# Patient Record
Sex: Male | Born: 1985 | Race: White | Hispanic: No | Marital: Married | Smoking: Never smoker
Health system: Southern US, Community
[De-identification: ages and names within clinical notes are randomized; demographics above are authoritative.]

## PROBLEM LIST (undated history)

## (undated) HISTORY — PX: APPENDECTOMY: SHX54

---

## 2015-06-30 ENCOUNTER — Emergency Department (HOSPITAL_COMMUNITY): Payer: Self-pay

## 2015-06-30 ENCOUNTER — Encounter (HOSPITAL_COMMUNITY): Payer: Self-pay

## 2015-06-30 ENCOUNTER — Emergency Department (HOSPITAL_COMMUNITY)
Admission: EM | Admit: 2015-06-30 | Discharge: 2015-06-30 | Disposition: A | Discharge status: Home / Self Care | Payer: Self-pay | Attending: Emergency Medicine | Admitting: Emergency Medicine

## 2015-06-30 DIAGNOSIS — H6692 Otitis media, unspecified, left ear: Secondary | ICD-10-CM

## 2015-06-30 DIAGNOSIS — R1033 Periumbilical pain: Secondary | ICD-10-CM | POA: Insufficient documentation

## 2015-06-30 DIAGNOSIS — R109 Unspecified abdominal pain: Secondary | ICD-10-CM

## 2015-06-30 LAB — URINALYSIS, ROUTINE W REFLEX MICROSCOPIC
BILIRUBIN URINE: NEGATIVE
GLUCOSE, UA: NEGATIVE mg/dL
Hgb urine dipstick: NEGATIVE
KETONES UR: NEGATIVE mg/dL
LEUKOCYTES UA: NEGATIVE
NITRITE: NEGATIVE
PROTEIN: NEGATIVE mg/dL
Specific Gravity, Urine: 1.019 (ref 1.005–1.030)
pH: 7 (ref 5.0–8.0)

## 2015-06-30 LAB — COMPREHENSIVE METABOLIC PANEL
ALK PHOS: 71 U/L (ref 38–126)
ALT: 30 U/L (ref 17–63)
AST: 23 U/L (ref 15–41)
Albumin: 4 g/dL (ref 3.5–5.0)
Anion gap: 7 (ref 5–15)
BILIRUBIN TOTAL: 0.4 mg/dL (ref 0.3–1.2)
BUN: 13 mg/dL (ref 6–20)
CALCIUM: 9.3 mg/dL (ref 8.9–10.3)
CO2: 27 mmol/L (ref 22–32)
CREATININE: 0.95 mg/dL (ref 0.61–1.24)
Chloride: 101 mmol/L (ref 101–111)
GFR calc Af Amer: >60 mL/min (ref >60)
GLUCOSE: 98 mg/dL (ref 65–99)
Potassium: 4.1 mmol/L (ref 3.5–5.1)
Sodium: 135 mmol/L (ref 135–145)
TOTAL PROTEIN: 7.7 g/dL (ref 6.5–8.1)

## 2015-06-30 LAB — LIPASE, BLOOD: Lipase: 34 U/L (ref 11–51)

## 2015-06-30 LAB — CBC
HCT: 47.3 % (ref 39.0–52.0)
Hemoglobin: 15.2 g/dL (ref 13.0–17.0)
MCH: 28.4 pg (ref 26.0–34.0)
MCHC: 32.1 g/dL (ref 30.0–36.0)
MCV: 88.4 fL (ref 78.0–100.0)
PLATELETS: 203 10*3/uL (ref 150–400)
RBC: 5.35 MIL/uL (ref 4.22–5.81)
RDW: 13.4 % (ref 11.5–15.5)
WBC: 6.9 10*3/uL (ref 4.0–10.5)

## 2015-06-30 MED ORDER — SODIUM CHLORIDE 0.9 % IV BOLUS (SEPSIS)
1000.0000 mL | Freq: Once | INTRAVENOUS | Status: AC
  Administered 2015-06-30: 1000 mL via INTRAVENOUS

## 2015-06-30 MED ORDER — AMOXICILLIN 500 MG PO CAPS
1000.0000 mg | ORAL_CAPSULE | Freq: Once | ORAL | Status: AC
Start: 2015-06-30 — End: 2015-06-30
  Administered 2015-06-30: 1000 mg via ORAL
  Filled 2015-06-30: qty 2

## 2015-06-30 MED ORDER — IOPAMIDOL (ISOVUE-300) INJECTION 61%
INTRAVENOUS | Status: AC
  Administered 2015-06-30: 100 mL
  Filled 2015-06-30: qty 100

## 2015-06-30 MED ORDER — ONDANSETRON HCL 4 MG/2ML IJ SOLN
4.0000 mg | Freq: Once | INTRAMUSCULAR | Status: AC
  Administered 2015-06-30: 4 mg via INTRAVENOUS
  Filled 2015-06-30: qty 2

## 2015-06-30 MED ORDER — METHOCARBAMOL 500 MG PO TABS: 500.0000 mg | ORAL_TABLET | Freq: Three times a day (TID) | ORAL | Status: AC | PRN

## 2015-06-30 MED ORDER — AMOXICILLIN 500 MG PO CAPS: 1000.0000 mg | ORAL_CAPSULE | Freq: Two times a day (BID) | ORAL | Status: AC

## 2015-06-30 MED ORDER — MORPHINE SULFATE (PF) 4 MG/ML IV SOLN
4.0000 mg | Freq: Once | INTRAVENOUS | Status: AC
  Administered 2015-06-30: 4 mg via INTRAVENOUS
  Filled 2015-06-30: qty 1

## 2015-06-30 NOTE — Discharge Instructions (Signed)
For pain control you may take up to 800mg  of Motrin (also known as ibuprofen). That is usually 4 over the counter pills,  3 times a day. Take with food to minimize stomach irritation   You can also take  tylenol (acetaminophen) 975mg  (this is 3 over the counter pills) four times a day. Do not drink alcohol or combine with other medications that have acetaminophen as an ingredient (Read the labels!).    For breakthrough pain you may take Robaxin. Do not drink alcohol, drive or operate heavy machinery when taking Robaxin.  Please follow with your primary care doctor in the next 2 days for a check-up. They must obtain records for further management.   Do not hesitate to return to the Emergency Department for any new, worsening or concerning symptoms.    Abdominal Pain, Adult Many things can cause abdominal pain. Usually, abdominal pain is not caused by a disease and will improve without treatment. It can often be observed and treated at home. Your health care provider will do a physical exam and possibly order blood tests and X-rays to help determine the seriousness of your pain. However, in many cases, more time must pass before a clear cause of the pain can be found. Before that point, your health care provider may not know if you need more testing or further treatment. HOME CARE INSTRUCTIONS Monitor your abdominal pain for any changes. The following actions may help to alleviate any discomfort you are experiencing:  Only take over-the-counter or prescription medicines as directed by your health care provider.  Do not take laxatives unless directed to do so by your health care provider.  Try a clear liquid diet (broth, tea, or water) as directed by your health care provider. Slowly move to a bland diet as tolerated. SEEK MEDICAL CARE IF:  You have unexplained abdominal pain.  You have abdominal pain associated with nausea or diarrhea.  You have pain when you urinate or have a bowel  movement.  You experience abdominal pain that wakes you in the night.  You have abdominal pain that is worsened or improved by eating food.  You have abdominal pain that is worsened with eating fatty foods.  You have a fever. SEEK IMMEDIATE MEDICAL CARE IF:  Your pain does not go away within 2 hours.  You keep throwing up (vomiting).  Your pain is felt only in portions of the abdomen, such as the right side or the left lower portion of the abdomen.  You pass bloody or black tarry stools. MAKE SURE YOU:  Understand these instructions.  Will watch your condition.  Will get help right away if you are not doing well or get worse.   This information is not intended to replace advice given to you by your health care provider. Make sure you discuss any questions you have with your health care provider.   Document Released: 10/30/2004 Document Revised: 10/11/2014 Document Reviewed: 09/29/2012 Elsevier Interactive Patient Education [employer]2016 [employer]

## 2015-06-30 NOTE — ED Notes (Signed)
Patient here with right sided abdominal pain x 24 hours. Describes as sharp with some diarrhea. No nausea, no vomiting. No change in pain with movement. Also complains of left ear pain

## 2015-06-30 NOTE — ED Notes (Signed)
Pt ambulates independently and with steady gait at time of discharge. Discharge instructions and follow up information reviewed with patient. No other questions or concerns voiced at this time.  

## 2015-06-30 NOTE — ED Provider Notes (Signed)
CSN: [id number]     Arrival date & time 06/30/15  [id number] History   First MD Initiated Contact with Patient 06/30/15 0930     Chief Complaint  Patient presents with  . Abdominal Pain     (Consider location/radiation/quality/duration/timing/severity/associated sxs/prior Treatment) HPI   Blood pressure 157/109, pulse 98, temperature 98.1 F (36.7 C), resp. rate 18, height 6\' 4"  (1.93 m), weight 97.523 kg, SpO2 97 %.  Jeremy Roth is a 30 y.o. male complaining of severe right sided periumbilical pain onset date before yesterday to 30 a.m., awoke from sleep, he rates it as 6 out of 10 right now,improvement with over-the-counter pain medication. He denies fever, chills, nausea, vomiting, diarrhea (note that this contradicts triage note however he has confirmed this with me), hematuria, change in urination. Patient states that he feels like he has a right-sided inguinal hernia which is not bothering him right now. He denies testicular pain or swelling. He has history of appendectomy but still has his gallbladder. He also reports discomfort in the left ear, has history of frequent sinusitis/ear infections.  History reviewed. No pertinent past medical history. Past Surgical History  Procedure Laterality Date  . Appendectomy     No family history on file. Social History  Substance Use Topics  . Smoking status: Never Smoker   . Smokeless tobacco: None  . Alcohol Use: None    Review of Systems  10 systems reviewed and found to be negative, except as noted in the HPI.   Allergies  Review of patient's allergies indicates no known allergies.  Home Medications   Prior to Admission medications   Not on File   BP 157/109 mmHg  Pulse 98  Temp(Src) 98.1 F (36.7 C)  Resp 18  Ht 6\' 4"  (1.93 m)  Wt 97.523 kg  BMI 26.18 kg/m2  SpO2 97% Physical Exam  Constitutional: He is oriented to person, place, and time. He appears well-developed and well-nourished. No distress.  HENT:  Head:  Normocephalic and atraumatic.  Mouth/Throat: Oropharynx is clear and moist.  Left Tympanic membrane is erythematous and bulging  Eyes: Conjunctivae and EOM are normal. Pupils are equal, round, and reactive to light.  Neck: Normal range of motion.  Cardiovascular: Normal rate, regular rhythm and intact distal pulses.   Pulmonary/Chest: Effort normal and breath sounds normal. No stridor. No respiratory distress. He has no wheezes. He has no rales. He exhibits no tenderness.  Abdominal: Soft. Bowel sounds are normal. He exhibits no distension and no mass. There is no tenderness. There is no rebound and no guarding.  No tenderness to palpation of any quadrant.  Genitourinary:  No CVA tenderness to percussion bilaterally.  No testicular pain or swelling, questionable reduced right inguinal hernia.  Musculoskeletal: Normal range of motion. He exhibits no edema or tenderness.  Neurological: He is alert and oriented to person, place, and time.  Psychiatric: He has a normal mood and affect.  Nursing note and vitals reviewed.   ED Course  Procedures (including critical care time) Labs Review Labs Reviewed  URINALYSIS, ROUTINE W REFLEX MICROSCOPIC (NOT AT [facility]) - Abnormal; Notable for the following:    APPearance CLOUDY (*)    All other components within normal limits  LIPASE, BLOOD  COMPREHENSIVE METABOLIC PANEL  CBC    Imaging Review No results found. I have personally reviewed and evaluated these images and lab results as part of my medical decision-making.   EKG Interpretation None      MDM   Final diagnoses:  Abdominal pain, unspecified abdominal location  Recurrent acute otitis media of left ear, unspecified otitis media type    Filed Vitals:   06/30/15 0929  BP: 157/109  Pulse: 98  Temp: 98.1 F (36.7 C)  Resp: 18  Height:  (1.93 m)  Weight: 97.523 kg  SpO2: 97%    Medications  sodium chloride 0.9 % bolus 1,000 mL (not administered)  morphine 4 MG/ML  injection 4 mg (not administered)  ondansetron (ZOFRAN) injection 4 mg (not administered)  amoxicillin (AMOXIL) capsule 1,000 mg (not administered)    Jeremy Roth is 30 y.o. male presenting with Right-sided abdominal pain and also left ear pain, abdominal exam is largely benign states is severe. Given the fact that it woke him from sleep, will further investigate with CT. Physical exam consistent for left-sided otitis media.  Blood work and CT reassuring, we'll give Robaxin for pain control and amoxicillin for otitis media.  Evaluation does not show pathology that would require ongoing emergent intervention or inpatient treatment. Pt is hemodynamically stable and mentating appropriately. Discussed findings and plan with patient/guardian, who agrees with care plan. All questions answered. Return precautions discussed and outpatient follow up given.   Discharge Medication List as of 06/30/2015  1:12 PM    START taking these medications   Details  amoxicillin (AMOXIL) 500 MG capsule Take 2 capsules (1,000 mg total) by mouth 2 (two) times daily., Starting 06/30/2015, Until Discontinued, Print    methocarbamol (ROBAXIN) 500 MG tablet Take 1 tablet (500 mg total) by mouth every 8 (eight) hours as needed for muscle spasms., Starting 06/30/2015, Until Discontinued, Print             Wynetta Emery, PA-C 06/30/15 1606  Tilden Fossa, MD 07/01/15 [id number]

## 2017-04-26 IMAGING — CT CT ABD-PELV W/ CM
2 of 4 series · 16 of 46 positions shown, 18 images · IV contrast (Omni 300)
Comparison: None.

CLINICAL DATA: Right-sided abdominal pain.

EXAM:
CT ABDOMEN AND PELVIS WITH CONTRAST
TECHNIQUE: Multidetector CT imaging of the abdomen and pelvis was performed
using the standard protocol following bolus administration of
intravenous contrast.
CONTRAST:  100mL VAX7QG-VCC IOPAMIDOL (VAX7QG-VCC) INJECTION 61%

[Series 2: a/p w/ 5mm · axial · 0.81mm/px · z∈[-560,-65]mm · 13 of 109 slices shown, 15 images]
[im 5/109  soft-tissue]
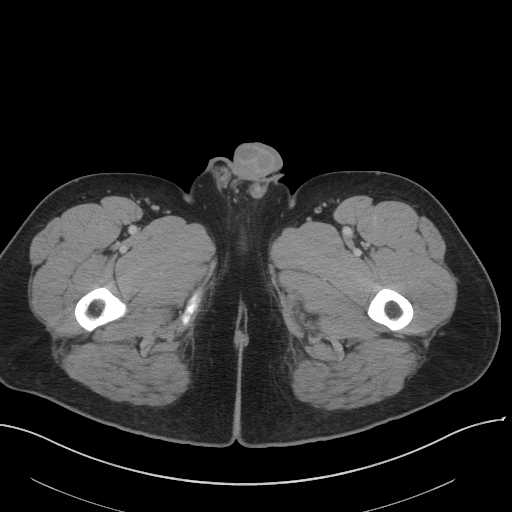
[im 5/109  bone]
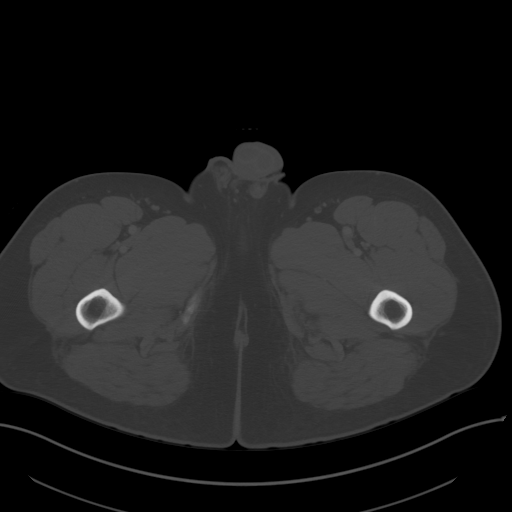
[im 13/109  soft-tissue]
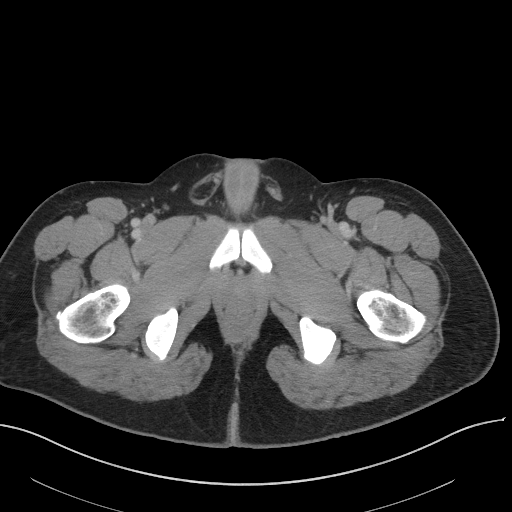
[im 21/109  soft-tissue]
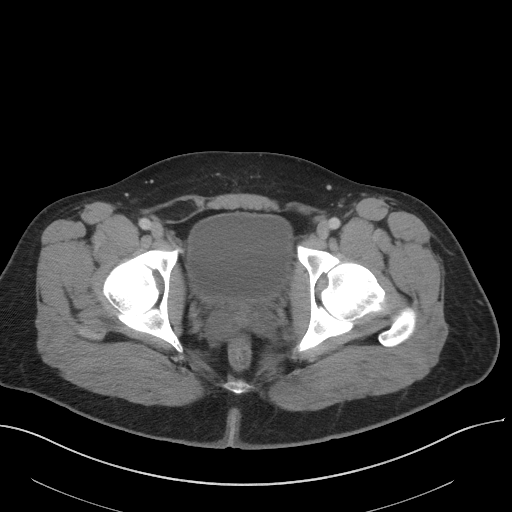
[im 30/109  soft-tissue]
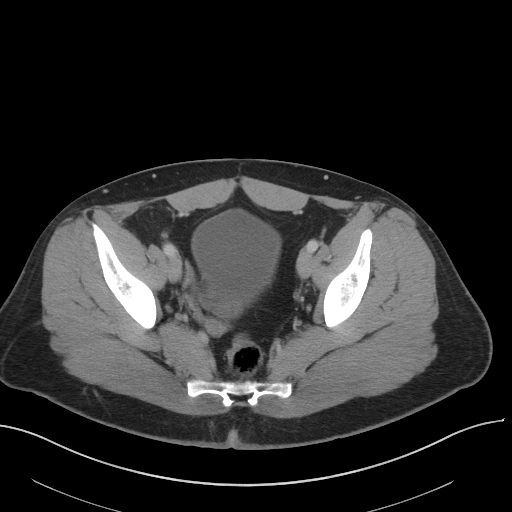
[im 38/109  soft-tissue]
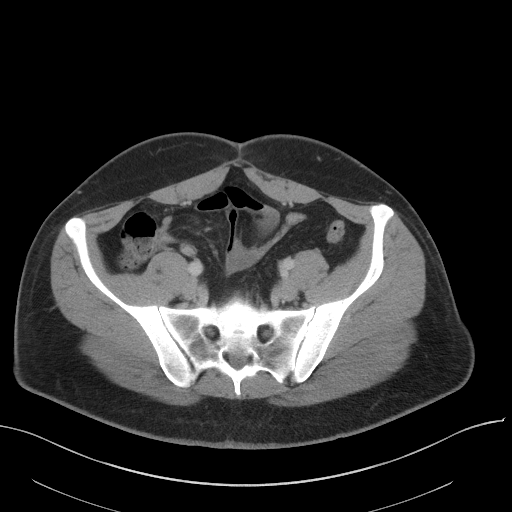
[im 46/109  soft-tissue]
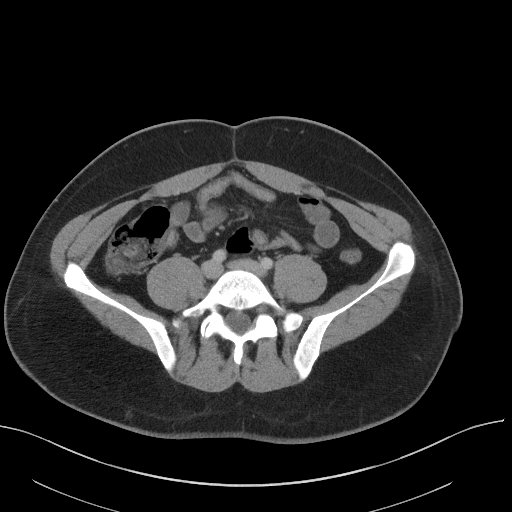
[im 55/109  soft-tissue]
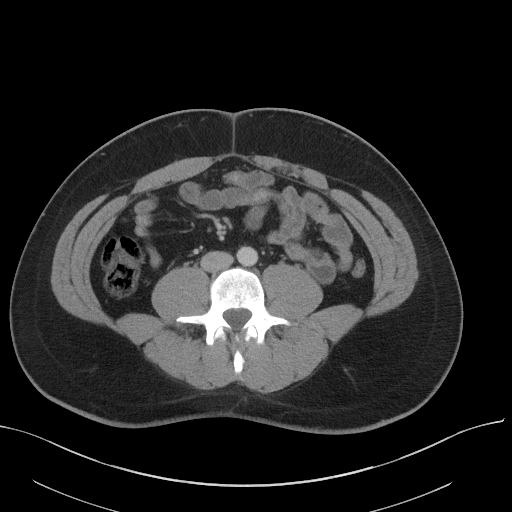
[im 63/109  soft-tissue]
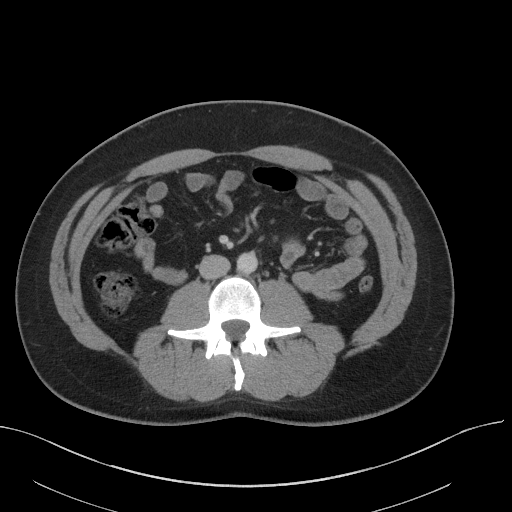
[im 71/109  soft-tissue]
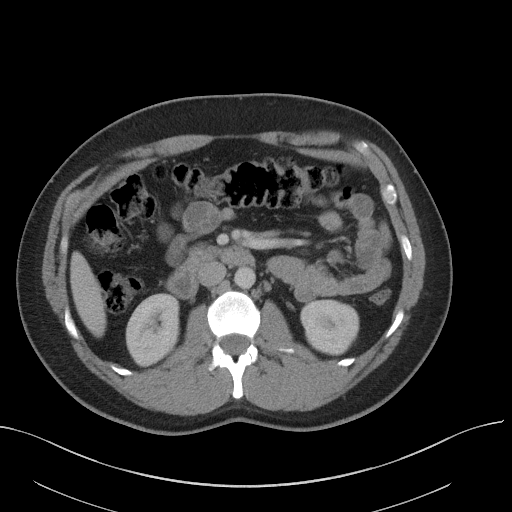
[im 71/109  bone]
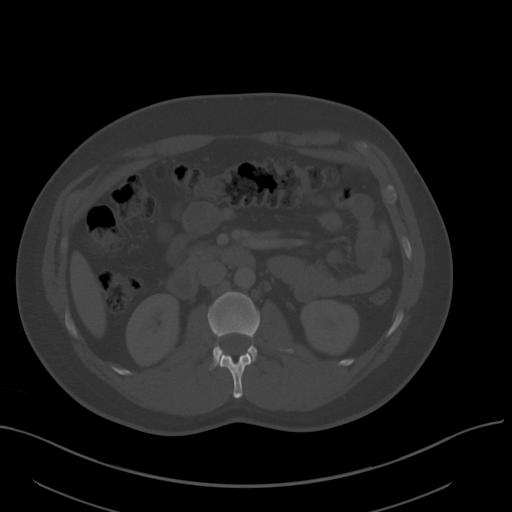
[im 79/109  soft-tissue]
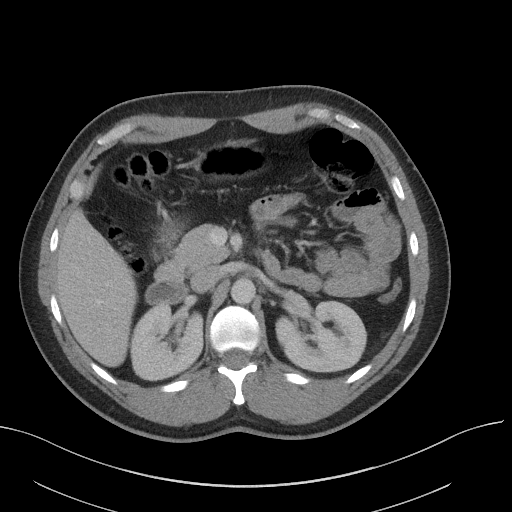
[im 88/109  soft-tissue]
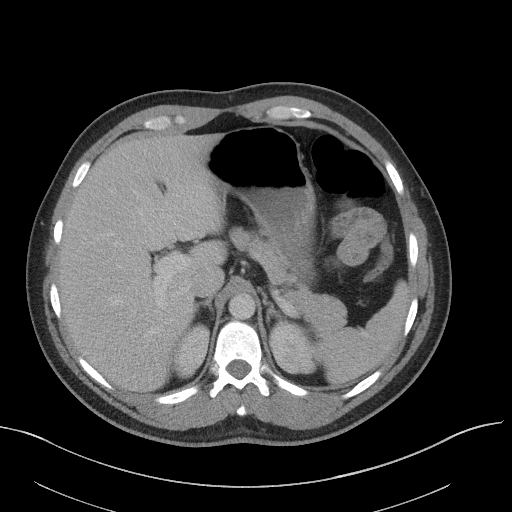
[im 96/109  soft-tissue]
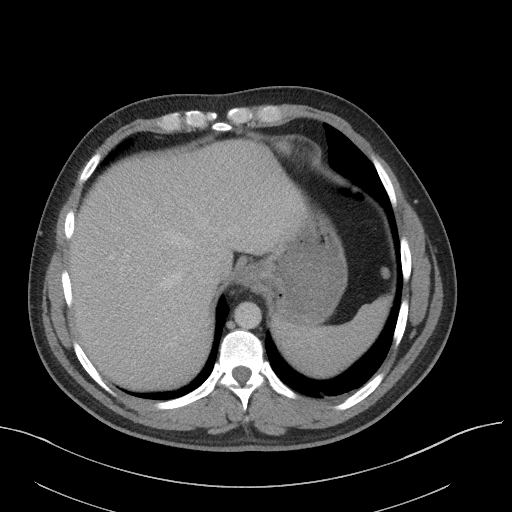
[im 104/109  soft-tissue]
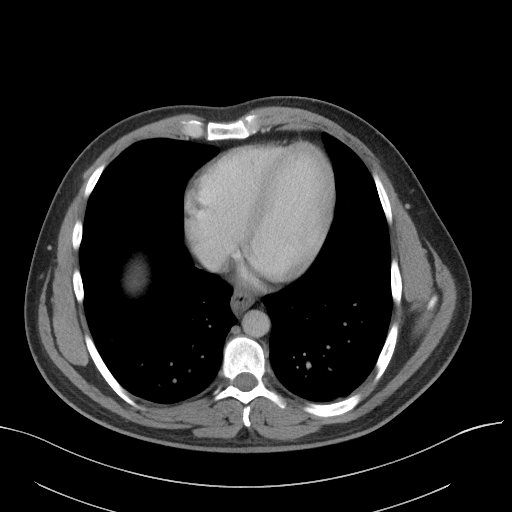

[Series 5: a/p w/ cor · coronal · 0.81mm/px · 3 of 138 slices shown]
[im 46/138  soft-tissue]
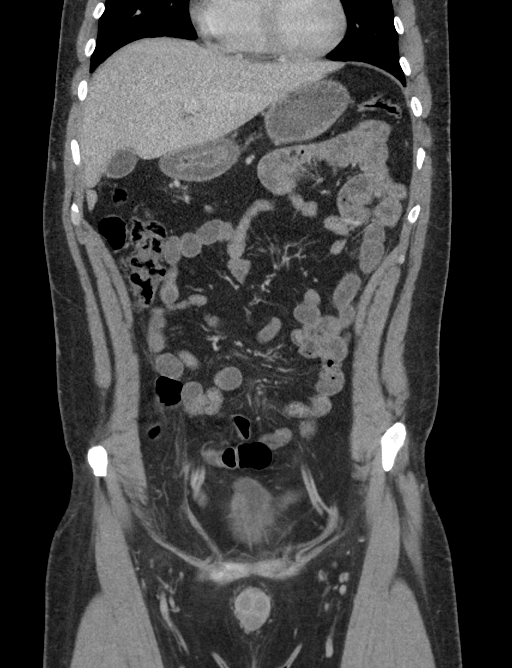
[im 61/138  soft-tissue]
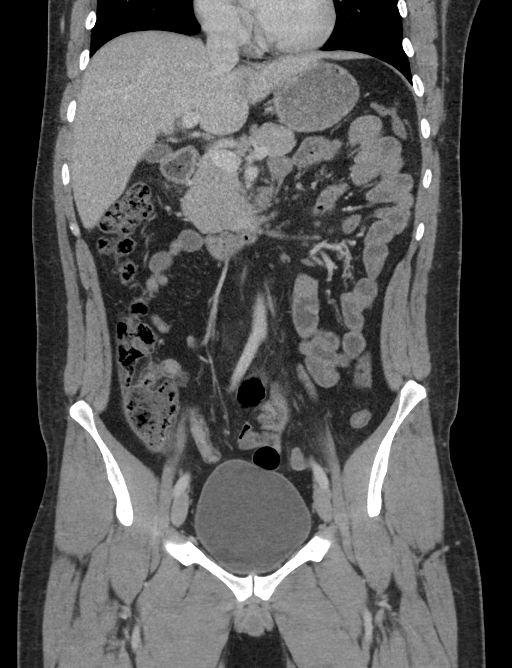
[im 77/138  soft-tissue]
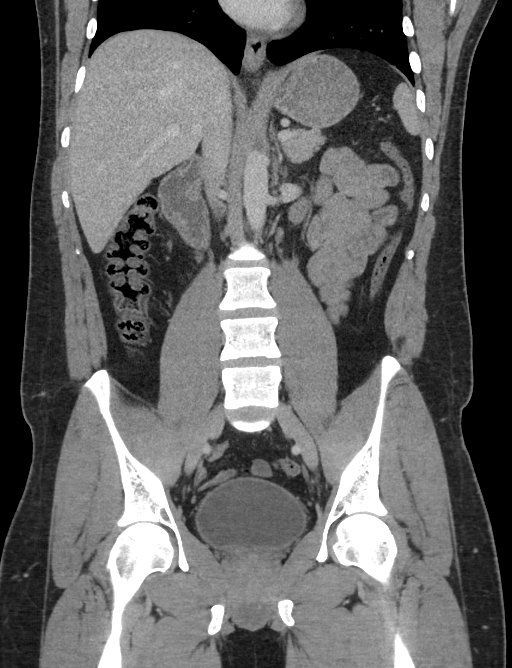

[16 of 46 positions shown; findings below may reference images not displayed]

FINDINGS: Visualized lung bases are unremarkable. No significant osseous
abnormality is noted.

No gallstones are noted. The liver, spleen and pancreas are
unremarkable. Adrenal glands and kidneys appear normal. No
hydronephrosis or renal obstruction is noted. No renal or ureteral
calculi are noted. There is no evidence of bowel obstruction. No
abnormal fluid collection is noted. Patient is status post
appendectomy. Urinary bladder appears normal. Mild prostatic
enlargement is noted with associated calcifications. No significant
adenopathy is noted. Moderate size fat containing right inguinal
hernia is noted.
IMPRESSION: Moderate fat containing right inguinal hernia. No other significant
abnormality seen in the abdomen or pelvis.
